# Patient Record
Sex: Male | Born: 1935 | Race: White | Hispanic: No | Marital: Married | Smoking: Former smoker
Health system: Southern US, Community
[De-identification: ages and names within clinical notes are randomized; demographics above are authoritative.]

---

## 2017-08-08 ENCOUNTER — Ambulatory Visit (INDEPENDENT_AMBULATORY_CARE_PROVIDER_SITE_OTHER): Payer: Medicare Other | Admitting: Internal Medicine

## 2017-08-08 ENCOUNTER — Ambulatory Visit (HOSPITAL_COMMUNITY)
Admission: RE | Admit: 2017-08-08 | Discharge: 2017-08-08 | Disposition: A | Discharge status: Home / Self Care | Payer: Medicare Other | Source: Ambulatory Visit | Attending: Internal Medicine | Admitting: Internal Medicine

## 2017-08-08 ENCOUNTER — Encounter: Payer: Self-pay | Admitting: Internal Medicine

## 2017-08-08 VITALS — BP 128/74 | HR 74 | Ht 68.0 in | Wt 177.0 lb

## 2017-08-08 DIAGNOSIS — J84112 Idiopathic pulmonary fibrosis: Secondary | ICD-10-CM

## 2017-08-08 DIAGNOSIS — I700 Atherosclerosis of aorta: Secondary | ICD-10-CM | POA: Diagnosis not present

## 2017-08-08 DIAGNOSIS — R590 Localized enlarged lymph nodes: Secondary | ICD-10-CM | POA: Insufficient documentation

## 2017-08-08 DIAGNOSIS — J849 Interstitial pulmonary disease, unspecified: Secondary | ICD-10-CM | POA: Diagnosis not present

## 2017-08-08 DIAGNOSIS — J9611 Chronic respiratory failure with hypoxia: Secondary | ICD-10-CM | POA: Diagnosis not present

## 2017-08-08 DIAGNOSIS — I2510 Atherosclerotic heart disease of native coronary artery without angina pectoris: Secondary | ICD-10-CM | POA: Insufficient documentation

## 2017-08-08 DIAGNOSIS — I288 Other diseases of pulmonary vessels: Secondary | ICD-10-CM | POA: Diagnosis not present

## 2017-08-08 NOTE — Progress Notes (Signed)
Subjective:    Patient ID: Aaron Chandler, male    DOB: [birth year], 84 y.o.   MRN: [id number]  PCP Aaron Covey, MD, [address], [place], [place], [address], telephone number [phone] Primary pulmonologist : Aaron Chandler, [address], [place], [place] [address], telephone number [phone] and fax number [phone]   HPI   [name] presents for new evaluation the ILD clinic at our care center network of lady with the pulmonary fibrosis foundation.  He is here for a second opinion on his pulmonary fibrosis and discussions about prognosis and treatment options.  He is accompanied by his daughter Aaron Chandler and his wife Aaron Chandler. history is provided by him and by talking to his wife and daughter and also by reviewing the outside medical records.     History from our [place]  interstitial lung disease specific questionnaire  Symptoms: He first noticed insidious onset of dyspnea after playing golf and while climbing up a hill.  This was in April while he was living in [place].  [place] is originally from [place].  He was seen by p local pulmonologist who referred him to Aaron Chandler at [facility] who gave him a diagnosis of IPF based on history, blood work and CT scan of the chest.  No biopsy was done according to the history.  He was initially only on observation therapy and basic supportive care.  In 2017 he moved to [place], [place] and started seeing Aaron Chandler where according to the notes he was initially on nintedanib and then after having intolerance to that since October 2018 has been on pirfenidone without any side effects other than mild low appetite.  He is continue to take Pirfenidone (Esbriet) at full dose currently.  He feels over time his disease has progressed.  Sometime in 2015 or 16 he started requiring oxygen.  And for the last year he has been on resting oxygen 4-6 L and with exertion  requiring 10-15 L.  He has been requiring this oxygen at least for the last 1 year.  His last CT scan was a few years ago.  Specifically he states that his pulmonary function test has been stable for the last few years but his oxygen needs have gotten worse.  In addition he tells me that his primary function test only showed moderate restriction but his oxygen needs seem out of proportion to that and is wondering why.  Of note, he also has a chronic cough since 2016 he has never had hemoptysis.  With progression cough is gotten worse but dyspnea is clearly the more important symptom.  Currently with the oxygen he has level 5 dyspnea walking on level at his own pace or walking on level with others of his age or walking up a hill or walking upstairs or making the bed or doing any shopping.  He has level 3 dyspnea for doing dishes or doing his daily toiletries.  He is completely overwhelmed by his shortness of breath.  In fact he is using a wheelchair in his apartment to get from place to place especially to the dining area  Key review of systems: He has had fatigue for the last few to several years.  He has had dysphagia for the last few to several years.  He has dry eyes for the last few to several years he has lost 15 pounds of weight in the last few to several months coinciding with his  intake of pirfenidone.  He has also has acid reflux heartburn for the last few to several months.  He does have some snoring and excessive sleepiness but no rash or ulcers  Past medical history: He has had SVT and I reviewed a cardiology note about this for the last few years.  He denies any COPD or collagen vascular disease or vasculitis or sleep apnea.  There is no diabetes or thyroid disease of stroke or seizures or hepatitis or tuberculosis or kidney disease or history of pneumonia or blood clots or pleurisy.  He did have a acute cholecystitis in 2017 and got septic from this.  They decided not operate on him because of his  pulmonary fibrosis  Family history of pulmonary disease: Denies any pulmonary fibrosis, COPD asthma sarcoid cystic fibrosis hypersensitivity pneumonitis or autoimmune disease  Exposure history: He did smoke cigars for 15 years but denies any mold exposure.  From 2 to age 66 he grew up being exposed to his parents smoking cigarettes  Occupational history: He was an Nutritional therapist and as such denies any occupation that can potentially predispose to interstitial lung disease either through organic or inorganic dust.  No ocular exposure history at home  Medication history: No evidence of taking any pulmonary toxic medications on extensive med review   [name] for review  PFT from [facility] Aaron Lindley [facility]  PFT data 10/26/15  02/09/17 07/17/17  FVC 2.18/64% 1.95/58% 2.28/68%  DLCO 8/42% 6.4/36% x   ECHO [facility] Oct 2017  - Moderate RV dilation. EF normal,.   LABS Nov 2018  - Normal LFT and hgb   Questions from him and family: There are specific questions are about prognosis, the role of anti-fibrotic therapy, measures of efficacy, possible involvement and research trials and difficulties with logistics are on high oxygen use. They seem particularly frustrated about [employer] and caps about o2 use and diffculty getting him the oxygen he needs        has no past medical history on file.   reports that he quit smoking about 20 years ago. His smoking use included cigars. He quit after 15.00 years of use. He has never used smokeless tobacco.Not on File  Immunization History  Administered Date(s) Administered  . Influenza, High Dose Seasonal PF 12/12/2016  . Pneumococcal-Unspecified 03/03/2016    No family history on file.   Current Outpatient Medications:  .  aspirin 81 MG EC tablet, Take by mouth., Disp: , Rfl:  .  bisacodyl (DULCOLAX) 5 MG EC tablet, Take 5 mg by mouth daily., Disp: , Rfl:  .  diltiazem (CARDIZEM CD) 120 MG 24 hr capsule, TK ONE C PO HS, Disp: ,  Rfl: 2 .  ESBRIET 801 MG TABS, , Disp: , Rfl: 10 .  Multiple Vitamins-Minerals (CENTRUM SILVER) CHEW, , Disp: , Rfl:  .  ondansetron (ZOFRAN-ODT) 4 MG disintegrating tablet, DISSOLVE 1-2 TABLETS UNDER TONGUE EVERY 6 HOURS AS NEEDED FOR NAUSEA, Disp: , Rfl: 2 .  OXYGEN, Inhale into the lungs., Disp: , Rfl:  .  pantoprazole (PROTONIX) 40 MG tablet, , Disp: , Rfl: 0 .  propafenone (RYTHMOL) 150 MG tablet, TAKE 1 TABLET BY MOUTH EVERY 8 HOURS, Disp: , Rfl:  .  simvastatin (ZOCOR) 40 MG tablet, Take 40 mg by mouth at bedtime., Disp: , Rfl: 1     Review of Systems  Constitutional: Positive for unexpected weight change. Negative for fever.  HENT: Positive for congestion, dental problem, postnasal drip and sneezing. Negative for  ear pain, nosebleeds, rhinorrhea, sinus pressure, sore throat and trouble swallowing.   Eyes: Negative for redness and itching.  Respiratory: Positive for cough, shortness of breath and wheezing. Negative for chest tightness.   Cardiovascular: Positive for palpitations. Negative for leg swelling.  Gastrointestinal: Positive for nausea. Negative for vomiting.  Genitourinary: Negative for dysuria.  Musculoskeletal: Negative for joint swelling.  Skin: Negative for rash.  Allergic/Immunologic: Negative.  Negative for environmental allergies, food allergies and immunocompromised state.  Neurological: Negative for headaches.  Hematological: Does not bruise/bleed easily.  Psychiatric/Behavioral: Positive for dysphoric mood. The patient is nervous/anxious.        Objective:   Physical Exam  Vitals:   08/08/17 1349  BP: 128/74  Pulse: 74  SpO2: 95%  Weight: 177 lb (80.3 kg)  Height:  (1.727 m)    Estimated body mass index is 26.91 kg/m as calculated from the following:   Height as of this encounter:  (1.727 m).   Weight as of this encounter: 177 lb (80.3 kg).    General Appearance:    Looks stable on motorized scooter  Head:    Normocephalic, without  obvious abnormality, atraumatic  Eyes:    PERRL - yes, conjunctiva/corneas - clea      Ears:    Normal external ear canals, both ears  Nose:   NG tube - no hbut has Nsasal o2 on  Throat:  ETT TUBE - no , OG tube - no  Neck:   Supple,  No enlargement/tenderness/nodules     Lungs:     Crackles 3./4 up poisteriorly and throughout anteriorly  Chest wall:    No deformity  Heart:    S1 and S2 normal, no murmur, CVP - no.  Pressors - no  Abdomen:     Soft, no masses, no organomegaly  Genitalia:    Not done  Rectal:   not done  Extremities:   Extremities- intact but has clubbing and some peripheral cyanosis     Skin:   Intact in exposed areas .     Neurologic:   Sedation - none -> RASS - na . Moves all 4s - yes. CAM-ICU - neg . Orientation - x3+          Assessment & Plan:     ICD-10-CM   1. IPF (idiopathic pulmonary fibrosis) (HCC) J84.112   2. Interstitial pulmonary disease (HCC) J84.9 CT Chest High Resolution  3. Chronic respiratory failure with hypoxia (HCC) J96.11    Diagnosis: Clinically it appears that based on a history and discussion that you indeed do have idiopathic pulmonary fibrosis.  Though I do not have his high-resolution CT scan of the chest all blood autoimmune profile to confidently call it is IPF I believe he has IPF based on the fact he has had one University physician and another community pulmonologist given the same diagnosis plus his history is pretty nonspecific for any exposures or specific ILD etiologies and he is a male over 50 and he has clubbing.   Severity: Based on the oxygen need it appears that he has very severe advanced IPF.  However it was striking that his forced vital capacity has remained stable in the last 2 years but his oxygen needs have gotten worse.  This appears to correlate with worsening DLCO.  Therefore I wonder if he has coexistent emphysema or worsening cor pulmonale.  Based on the 2017 October echo I suspect that he has significant  pulmonary hypertension/cor pulmonale contributing to his  worsening oxygen needs and chronic hypoxemic respiratory failure.  This would be a very difficult condition to treat particularly in the setting of IPF which would make it group 3 PAH.  I have recommended an echo to start with.  He wants to do it locally and send me the report which I have agreed to.  However he will do a high-resolution CT scan of the chest here locally so he goes back to the [place] area today.   Treatment: Oxygen- most of the frustrations appear to be around his oxygen needs.  The family is extremely frustrated with [employer] the current oxygen supplier.  They have talked to [employer] another oxygen company.  They are frustrated with the poor customer service and the cap on the flow being made by the oxygen company.  The increased oxygen needs is restricting his mobility.  They are also worried about payment denials.  They are also frustrated by the lack of innovative solutions by the oxygen company to give him more oxygen.  I have given him a link for a survey to the pulmonary fibrosis foundation to help address this issue that many IPF patients face.  This will not solve his problem per se.  I have connected him with Mr. Army Chaco of patient support group chair in [facility] to see if any patient related solutions could be ironed out for Mr. Reddy  Treatment Pirfenidone (Esbriet): We discussed Pirfenidone (Esbriet).  We recognize the inherent limitations in assessing whether this drug is being efficacious for him.  Given the minimal amount of side effects that he is having and the advanced disease and some of the emerging data about benefit even in the face of progression I think it is best that he continues the drug.   Research trials: He and his daughter are very interested in research trials.  Through our research partner [employer], [facility] the office several research trials for IPF.  However the general criteria appears  to be that for all face to in phase 3 trials the diagnosis is to be within 2 or 3 years.  He falls outside his window given the fact his diagnosis was in April 2014.  Moreover his extreme low DLCO would be a disqualification.  We might have a phase 1 trial but he is not appropriately interested in this because of the first time it would go from animal to human.  General supportive measures: When he comes back at his next visit I could try to introduce him inspiratory muscle load training.  There is no clear-cut benefit about this but of had 1 or 2 patients tell me that this is helped them subjectively  Other  pillars of ILD /IPF / Pulmonary Fibrosis Managment to be discussed over time  - nutrition: not discussed  - support groups: not fully discussed but have connected him to our local support group chair to help him with o2 issue  - transplant: not a candidate du to age  - goals and palliation: to be discussed     > 50% of this > 40 min visit spent in face to face counseling or/and coordination of care    Aaron. Kalman Shan, M.D., [facility].C.P Pulmonary and Critical Care Medicine Staff Physician, [facility] Center Director - Interstitial Lung Disease  Program  Pulmonary Fibrosis [facility] at [facility], [place], [address]  Pager: [phone], If no answer or between  15:00h - 7:00h: call [phone] Telephone: [phone]

## 2017-08-08 NOTE — Patient Instructions (Signed)
ICD-10-CM   1. IPF (idiopathic pulmonary fibrosis) (HCC) J84.112   2. Interstitial pulmonary disease (HCC) J84.9 CT Chest High Resolution  3. Chronic respiratory failure with hypoxia (HCC) J96.11    Clinically it appears that based on a history and discussion that you indeed do have idiopathic pulmonary fibrosis  Based on oxygen need it appears that you have advanced disease.   However it is unclear to me why you are forced vital capacity on the pulmonary function test has not changed in 2 years although your oxygen need has gotten worse during this time  Therefore we need to rule out some other pulmonary related conditions that might or might not exist  Also given the greater than 5 years since diagnosis and advance oxygen need most of the research trials are not under consideration at this point in time  Currently it appears that you are mostly limited because of your profound oxygen needs with exertion and you are having a frustrating time getting adequate oxygen supply  It is impossible to know if the Pirfenidone [employer]) is working in your particular situation or not but given the fact that you are tolerating it well and there is potential for benefit you should continue that   Plan I believe that you should continue Pirfenidone (Esbriet) and Protonix as recommended by your local pulmonary physician but please ensure that you have your liver function tests checked every 3 months and he apply sunscreen  Do echocardiogram locally particularly looking for pulmonary hypertension  Do high-resolution CT chest today with [place] (supine image only]  Continue oxygen as before  I have connected you with Mr. Army Chaco via email to address some of the oxygen questions and for you to fill out the oxygen survey

## 2017-08-09 ENCOUNTER — Other Ambulatory Visit: Payer: Self-pay | Admitting: *Deleted

## 2017-08-09 DIAGNOSIS — J84112 Idiopathic pulmonary fibrosis: Secondary | ICD-10-CM

## 2017-08-09 DIAGNOSIS — J849 Interstitial pulmonary disease, unspecified: Secondary | ICD-10-CM

## 2017-08-10 ENCOUNTER — Telehealth: Payer: Self-pay | Admitting: Internal Medicine

## 2017-08-10 DIAGNOSIS — J849 Interstitial pulmonary disease, unspecified: Secondary | ICD-10-CM

## 2017-08-10 NOTE — Telephone Encounter (Signed)
  Gave CT repoirts to wife - c/with UIP seen in IPF Hypoxemia lkely du eto pulm htn and ILD.  Wife does not remember patient serologies were done in [place]. So, likely done years ago at [facility]. Recommended we do those through PCP Carley Hammed, MD office and iif positive and echo abnormal for elevated PASP might be worth pursuing pulm htn workup  So Raquel Sarna please call PCP office and have them do for [place]   - Serum: ESR, ACE, ANA, DS-DNA, RF, anti-CCP, ssA, ssB, scl-70, ANCA screen, MPO, PR-3, Total CK,  RNP, Aldolase,  Hypersensitivity Pneumonitis Panel  - also patient echo 08/22/17 - will need those results sent to [place]  - further followwup based on above  -    Ct Chest High Resolution  Result Date: 08/08/2017 CLINICAL DATA:  Interstitial lung disease. Chronic hypoxia. Weight loss of 20 pounds over the prior 3 months. Cough. Former cigar smoker. EXAM: CT CHEST WITHOUT CONTRAST TECHNIQUE: Multidetector CT imaging of the chest was performed following the standard protocol without intravenous contrast. High resolution imaging of the lungs, as well as inspiratory and expiratory imaging, was performed. COMPARISON:  None. FINDINGS: Cardiovascular: Top-normal heart size. No significant pericardial effusion/thickening. Three-vessel coronary atherosclerosis. Atherosclerotic nonaneurysmal thoracic aorta. Dilated main pulmonary artery (3.7 cm diameter). Mediastinum/Nodes: No discrete thyroid nodules. Unremarkable esophagus. No axillary adenopathy. Mildly enlarged 1.2 cm right paratracheal node (series 2/image 45). Mildly enlarged 1.2 cm subcarinal node (series 2/image 66). Mildly enlarged 1.0 cm AP window node (series 2/image 54). No discrete hilar adenopathy on these noncontrast images. Lungs/Pleura: No pneumothorax. No pleural effusion. No acute consolidative airspace disease, lung masses or significant pulmonary nodules. No significant air trapping on the expiration sequence. There is extensive patchy  confluent subpleural reticulation and ground-glass attenuation throughout both lungs with associated mild-to-moderate traction bronchiectasis, architectural distortion and volume loss. There is a dependent basilar gradient to these findings. There is scattered mild honeycombing at both lung bases, most prominent at the right lung base (series 4/image 21). Upper abdomen: Scattered pneumobilia in the visualized liver without intrahepatic biliary ductal dilatation. Musculoskeletal: No aggressive appearing focal osseous lesions. Marked thoracic spondylosis. IMPRESSION: 1. Spectrum of findings compatible with dependent basilar predominant fibrotic interstitial lung disease with mild honeycombing. 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 2. Nonspecific mild mediastinal lymphadenopathy, probably reactive. 3. Dilated main pulmonary artery, suggesting pulmonary arterial hypertension. 4. Three-vessel coronary atherosclerosis. 5. Pneumobilia in the visualized liver. Presumably this is due to a history of sphincterotomy or biliary enteric anastomosis. Recommend correlation with procedural history. If there is no procedural history to explain this finding, recommend dedicated CT abdomen/pelvis with oral and IV contrast for further evaluation. Aortic Atherosclerosis (ICD10-I70.0). Electronically Signed   By: Ilona Sorrel M.D.   On: 08/08/2017 17:53

## 2017-08-11 NOTE — Telephone Encounter (Signed)
Called [facility] where pt's pcp Dr. Ali Lowe is located at and spoke with Darlene.  I stated to her we were wanting pt to have some labwork done at that office due to pt being closer to pcp office than our office of [facility].  I stated to Darlene that I would order the labwork and then fax it to their office.  The fax number is [phone] which is the fax number for the corporate office. She stated to me to put in attention to Dr. Maisie Fus so that way he would get the lab orders.  I am also going to fax pt's most recent HRCT scan and OV from our office to them as well so that way they have those records.

## 2017-08-17 ENCOUNTER — Telehealth: Payer: Self-pay | Admitting: Internal Medicine

## 2017-08-17 NOTE — Telephone Encounter (Signed)
Attempted to contact Victorino Dike I did not receive an answer. There was no option for me to leave a message. Will try back.

## 2017-08-18 NOTE — Telephone Encounter (Signed)
lmtcb for Jennifer.  °

## 2017-08-22 ENCOUNTER — Encounter: Payer: Self-pay | Admitting: Internal Medicine

## 2017-08-22 NOTE — Telephone Encounter (Signed)
[facility] needs the orders for labs faxed over Fax#[phone]  Attention Leisa Lenz  [phone]

## 2017-08-22 NOTE — Telephone Encounter (Signed)
Spoke with [facility] and they needed the labs and echo order faxed to their office. The patient has an appt at their office at 1:45 and needs the order as soon as possible. Orders sent to [phone]. The operator stated she received them and nothing further is needed.

## 2017-08-22 NOTE — Telephone Encounter (Signed)
Called Victorino Dike back and she was unavailable at the time but I left message for her to call back to discuss. Will await return call.

## 2017-08-22 NOTE — Telephone Encounter (Signed)
lmtcb for [employer]. Need to know what labs are being requested.

## 2017-08-29 ENCOUNTER — Telehealth: Payer: Self-pay | Admitting: Internal Medicine

## 2017-08-29 NOTE — Telephone Encounter (Signed)
Returned Patient call. Spoke with [name].  Echo was done 08/24/17 per Patient.  Explained echo results were not available at this time, but someone will contact her as soon as they are.  Nothing further needed at this time.

## 2017-08-30 ENCOUNTER — Telehealth: Payer: Self-pay | Admitting: Internal Medicine

## 2017-08-30 NOTE — Telephone Encounter (Signed)
Attempted to call pt's wife. I did not receive an answer. I have left a message for pt's wife to return our call.

## 2017-08-31 NOTE — Telephone Encounter (Signed)
Lab orders have been faxed. Pt's wife is aware. Nothing further was needed.

## 2017-09-07 ENCOUNTER — Other Ambulatory Visit: Payer: Self-pay | Admitting: Internal Medicine

## 2017-09-07 ENCOUNTER — Telehealth: Payer: Self-pay | Admitting: Internal Medicine

## 2017-09-07 NOTE — Telephone Encounter (Signed)
Called and spoke with patients wife, advised her that we have sent the fax to [employer] [employer] again. Advised her to give us a call if anything else is needed.

## 2017-09-07 NOTE — Telephone Encounter (Signed)
His appt is at 11:00 am this morning and need sent ASAP.

## 2017-09-07 NOTE — Telephone Encounter (Signed)
Let Aaron RabonDonald Chandler know tht I reviewed echo 08/22/17 and this shows right heart failure - this is because of the IPF and accuotns for more o2 need. I did d/w a IPF specialist in [place] - and at this point for Aaron Rabononald [place] - o2 and esbriet best possible. REcommedn home palliation  Happy to see him again if he wants to make the trip  Please send my note to his pulm - only if he gives green signal on that  Dr. Kalman ShanMurali Dalene Robards, M.D., [facility].C.P Pulmonary and Critical Care Medicine Staff Physician, [facility] Center Director - Interstitial Lung Disease  Program  [facility]ulmonary Fibrosis [facility] [place], [place], [id number]  Pager: [phone], If no answer or between  15:00h - 7:00h: call [phone] Telephone: [phone]

## 2017-09-08 NOTE — Telephone Encounter (Signed)
Spoke with pt. He is aware of his Echo results. Pt is requesting to have his lab results from 09/07/17.  Dr. Conni Elliotamawamy - please advise on results. Thanks.

## 2017-09-08 NOTE — Telephone Encounter (Signed)
Pt is returning call about results. Cb is [phone]

## 2017-09-08 NOTE — Telephone Encounter (Signed)
Attempted to call pt but unable to reach him.  Left message for pt to return call x1 

## 2017-09-11 ENCOUNTER — Telehealth: Payer: Self-pay | Admitting: Internal Medicine

## 2017-09-11 LAB — PAN-ANCA
ANCA Proteinase 3: <3.5 U/mL (ref 0.0–3.5)
Atypical pANCA: <1:20 {titer}
Myeloperoxidase Ab: <9 U/mL (ref 0.0–9.0)
P-ANCA: <1:20 {titer}

## 2017-09-11 LAB — SJOGREN'S SYNDROME ANTIBODS(SSA + SSB)
ENA SSA (RO) Ab: <0.2 AI (ref 0.0–0.9)
ENA SSB (LA) Ab: <0.2 AI (ref 0.0–0.9)

## 2017-09-11 LAB — SEDIMENTATION RATE: Sed Rate: 60 mm/hr — ABNORMAL HIGH (ref 0–30)

## 2017-09-11 LAB — ANTI-DNA ANTIBODY, DOUBLE-STRANDED

## 2017-09-11 LAB — HYPERSENSITIVITY PNUEMONITIS PROFILE
A. PULLULANS ABS: NEGATIVE
ASPERGILLUS FLAVUS ANTIBODIES: NEGATIVE
ASPERGILLUS FUMIGATUS 2: NEGATIVE
Aspergillus Niger Antibodies: NEGATIVE
Micropolyspora faeni, IgG: NEGATIVE
Pigeon Serum Abs: NEGATIVE
THERMOACTINOMYCES VULGARIS IGG: NEGATIVE

## 2017-09-11 LAB — CK TOTAL AND CKMB (NOT AT ARMC)
CK-MB Index: 3.3 ng/mL (ref 0.0–10.4)
Total CK: 56 U/L (ref 24–204)

## 2017-09-11 LAB — ANA: Anti Nuclear Antibody(ANA): NEGATIVE

## 2017-09-11 LAB — CYCLIC CITRUL PEPTIDE ANTIBODY, IGG/IGA: CYCLIC CITRULLIN PEPTIDE AB: 4 U (ref 0–19)

## 2017-09-11 LAB — RHEUMATOID FACTOR

## 2017-09-11 LAB — ANTI-SCLERODERMA ANTIBODY: Scleroderma SCL-70: <0.2 AI (ref 0.0–0.9)

## 2017-09-11 LAB — RNP ANTIBODIES: ENA RNP AB: 0.2 AI (ref 0.0–0.9)

## 2017-09-11 LAB — ANGIOTENSIN CONVERTING ENZYME: ANGIO CONVERT ENZYME: 40 U/L (ref 14–82)

## 2017-09-11 LAB — ALDOLASE: Aldolase: 3.6 U/L (ref 3.3–10.3)

## 2017-09-11 NOTE — Telephone Encounter (Signed)
Spoke with the pt's spouse  She is requesting lab results and also wants further information on his heart failure found on ECHO  Please advise, thanks

## 2017-09-11 NOTE — Telephone Encounter (Signed)
Pt was called today, 6/17 and made aware of lab results. Nothing further needed.

## 2017-09-11 NOTE — Telephone Encounter (Signed)
Called and spoke with pt letting him know the results of his labwork and also his confirming dx of IPF and based on the dx that his Esbriet and O2 are what is the best option even with pulm htn seen on echo.  Pt expressed understanding with the above stated but wanted to know if there was anything pt needed to worry about with what was seen on the scan (if pt needed to worry about heart attacks or anything more serious and also the severity of the IPF).  MR, pt would like for you to call him at your convenience to discuss things with him.  Pt can be reached at [phone]. Thanks!

## 2017-09-11 NOTE — Telephone Encounter (Signed)
Blood lab 09/07/17 - no evidence of autoimmune or vasculitis. THereby, confirming diagnosis of IPF  REgarding pulmonary hypertension on echo - in his particular sitution iwht IPF  - pulm htn meds do not work. O2/esbriet best option  If they still need to d/w me let me know    Results for Lorina RabonSTEWART, Ulisses (MRN [id number]) as of 09/11/2017 15:39  Ref. Range 09/07/2017 10:47  CK Total Latest Ref Range: 24 - 204 U/L 56  CK-MB Index Latest Ref Range: 0.0 - 10.4 ng/mL 3.3  Aldolase Latest Ref Range: 3.3 - 10.3 U/L 3.6  Sed Rate Latest Ref Range: 0 - 30 mm/hr 60 (H)  A fumigatus #2 Latest Ref Range: NEGATIVE  Negative  Anti Nuclear Antibody(ANA) Latest Ref Range: Negative  Negative  ANCA Proteinase 3 Latest Ref Range: 0.0 - 3.5 U/mL <3.5  Atypical pANCA Latest Ref Range: Neg:<1:20 titer <1:20  Cyclic Citrullin Peptide Ab Latest Ref Range: 0 - 19 units 4  dsDNA Ab Latest Ref Range: 0 - 9 IU/mL <1  ENA RNP Ab Latest Ref Range: 0.0 - 0.9 AI 0.2  ENA SSA (RO) Ab Latest Ref Range: 0.0 - 0.9 AI <0.2  ENA SSB (LA) Ab Latest Ref Range: 0.0 - 0.9 AI <0.2  Myeloperoxidase Ab Latest Ref Range: 0.0 - 9.0 U/mL <9.0  RA Latex Turbid. Latest Ref Range: 0.0 - 13.9 IU/mL <10.0  Scleroderma SCL-70 Latest Ref Range: 0.0 - 0.9 AI <0.2  Cytoplasmic (C-ANCA) Latest Ref Range: Neg:<1:20 titer <1:20  P-ANCA Latest Ref Range: Neg:<1:20 titer <1:20  Micropolyspora faeni, IgG Latest Ref Range: Negative  Negative  Thermoactinomyces vulgaris, IgG Latest Ref Range: Negative  Negative  A. Pullulans Abs Latest Ref Range: Negative  Negative  Pigeon Serum Abs Latest Ref Range: Negative  Negative  Aspergillus Luxembourgiger Antibodies Latest Ref Range: Negative  Negative  Aspergillus Flavus Antibodies Latest Ref Range: Negative  Negative

## 2017-09-18 NOTE — Telephone Encounter (Signed)
MR, please advise if anything else is needed. Thanks!  

## 2017-09-21 NOTE — Telephone Encounter (Addendum)
D.w wife - on 10 + 10 [place] o2 at night. And 6-8:L [place] at day time. Advised cor pulmonale is refractory. Nose is running. D/w wife about palliative care v hospice. Recommmend home palliative care. ANd to talk to pcp or local pulmonary doc. And advised to take esbriet as long as possible. No further followup. Given life expectancy of several months to under a year.. Or rarely very rarely few years

## 2017-09-22 ENCOUNTER — Telehealth: Payer: Self-pay | Admitting: Internal Medicine

## 2017-09-22 NOTE — Telephone Encounter (Signed)
Patient daughter, Geoffery SpruceLeann called.  She is questioning medication being giving to treat pulmonary hypertension.  She said that they were told that Patient had pulmonary hypertension. She stated that MR called 09/21/17, about possible palliative care.  She is wanting to know if there is anything left that can be done.  She is requesting to speak to MR if possible. Nothing in chart about phone call 09/21/17.  Daughter Geoffery [phone]  Will route message to MR

## 2017-09-23 NOTE — Telephone Encounter (Signed)
Called daughter Aaron Chandler - >  1. Pulm HTN -> advised no medicine for this in setting of IPF. rx is supportive 2. Explained most likely mechanism of death - is progressive resp failure 3. Daughter says he is significantly worse 4. Recommend palliative care/hospice -> hospice over pall care recommended   10 min call   Dr. [name], M.D., [facility].P Pulmonary and Critical Care Medicine Staff Physician, [facility] Director - Interstitial Lung Disease  Program  Pulmonary Fibrosis [facility] [place], [place], [id number]  Pager: [phone], If no answer or between  15:00h - 7:00h: call [phone] Telephone: [phone]

## 2017-11-26 DEATH — deceased

## 2019-09-10 IMAGING — CT CT CHEST HIGH RESOLUTION W/O CM
2 of 7 series · 14 of 36 positions shown, 17 images · non-contrast
Comparison: None.

CLINICAL DATA: Interstitial lung disease. Chronic hypoxia. Weight
loss of 20 pounds over the prior 3 months. Cough. Former cigar
smoker.

EXAM:
CT CHEST WITHOUT CONTRAST
TECHNIQUE: Multidetector CT imaging of the chest was performed following the
standard protocol without intravenous contrast. High resolution
imaging of the lungs, as well as inspiratory and expiratory imaging,
was performed.

[Series 6: coronal · coronal · 0.55mm/px · 3 of 89 slices shown]
[im 18/89  lung]
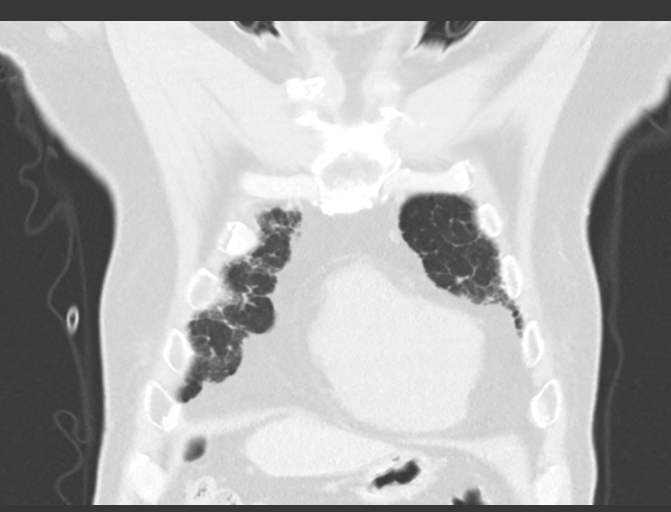
[im 36/89  lung]
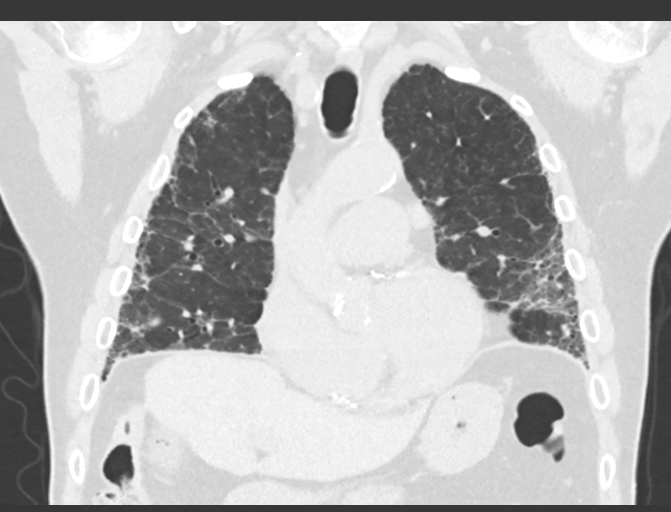
[im 53/89  lung]
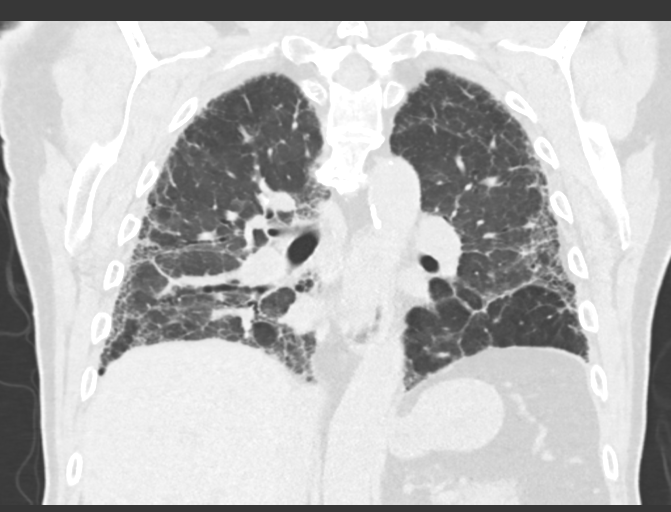

[Series 8: super d · axial · 0.71mm/px · z∈[-329,-97]mm · 11 of 268 slices shown, 14 images]
[im 18/268  mediastinal]
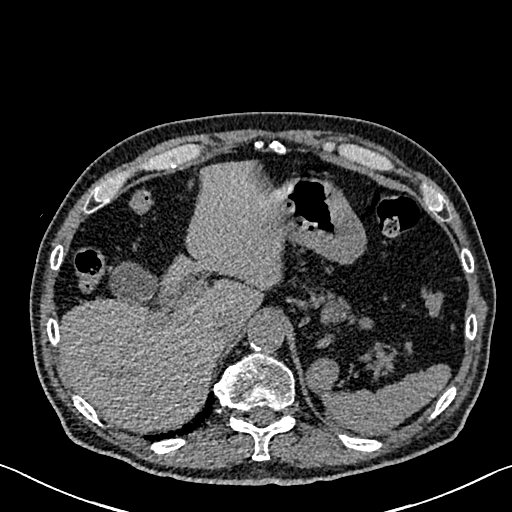
[im 18/268  lung]
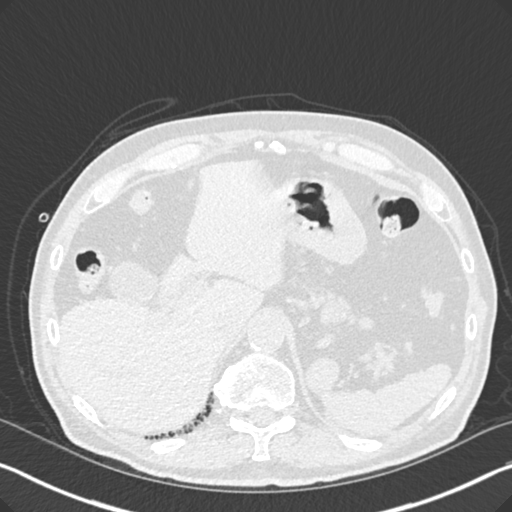
[im 36/268  lung]
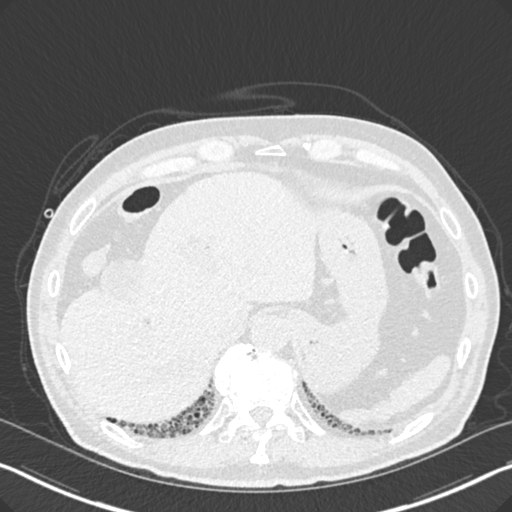
[im 72/268  lung]
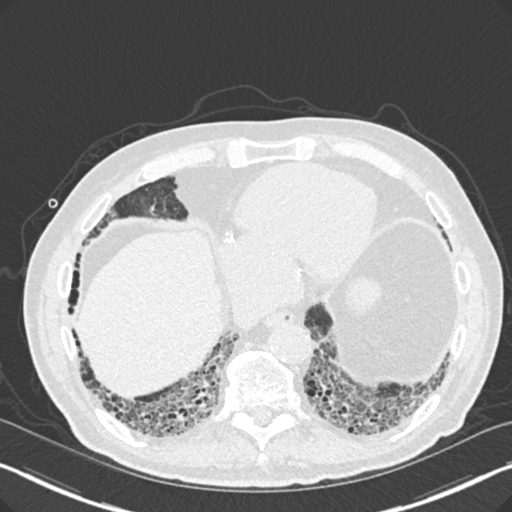
[im 90/268  lung]
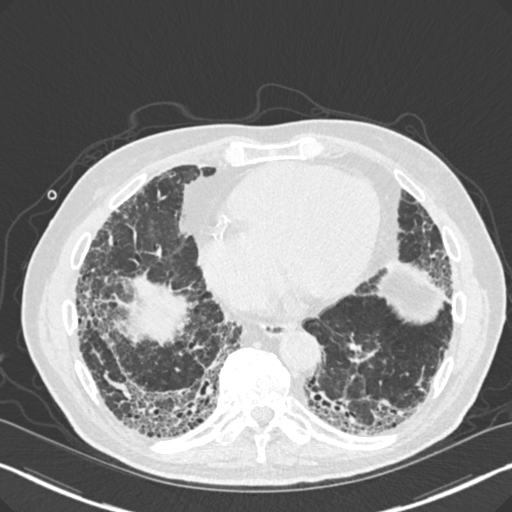
[im 107/268  mediastinal]
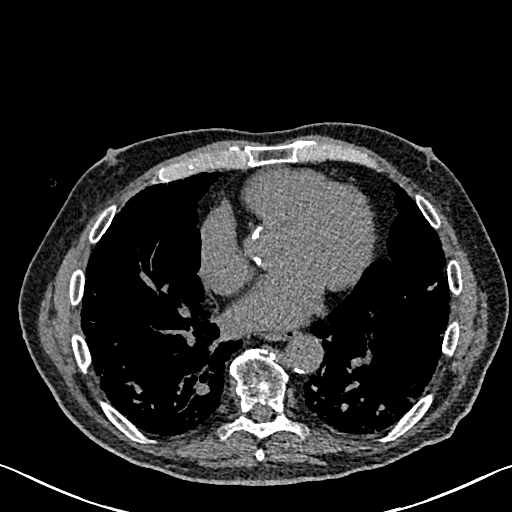
[im 107/268  lung]
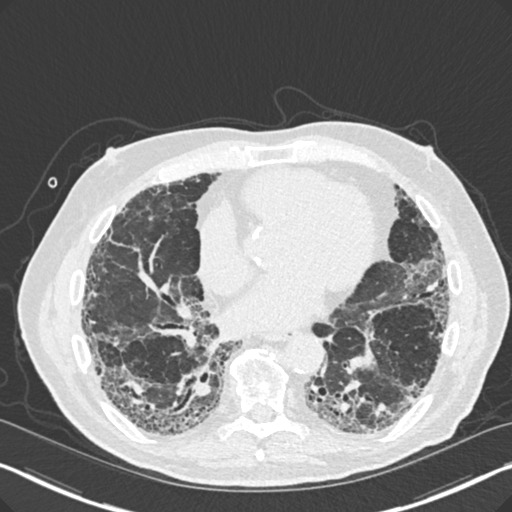
[im 143/268  lung]
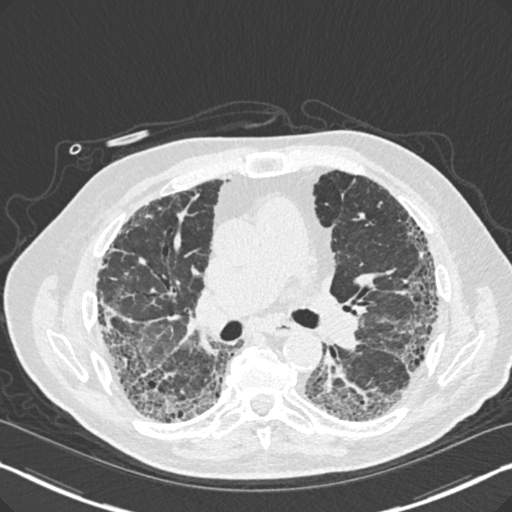
[im 161/268  lung]
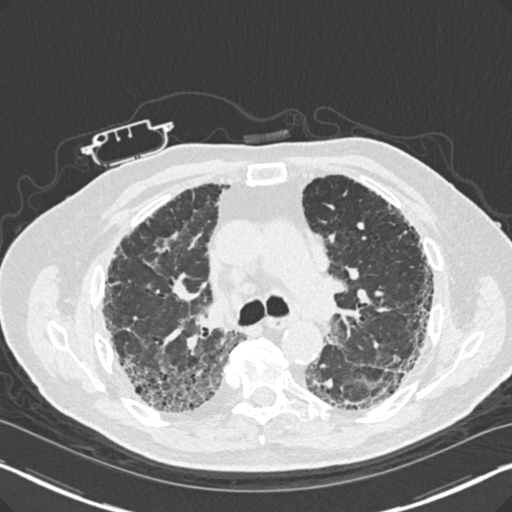
[im 179/268  lung]
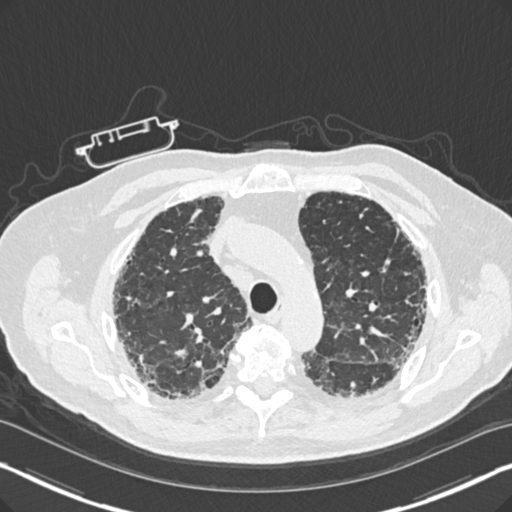
[im 196/268  mediastinal]
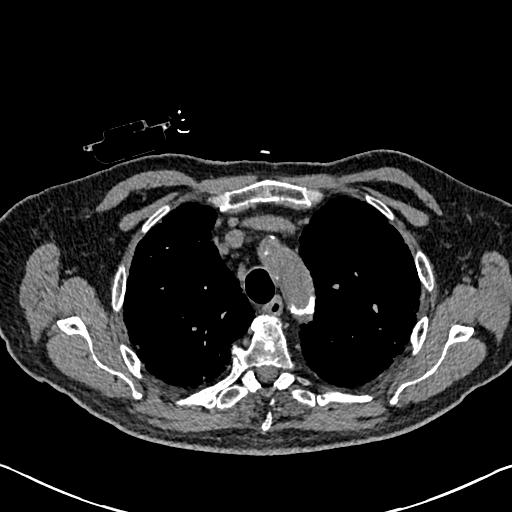
[im 196/268  lung]
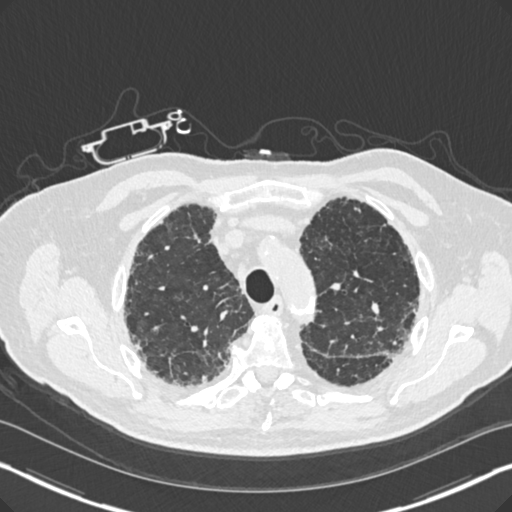
[im 232/268  lung]
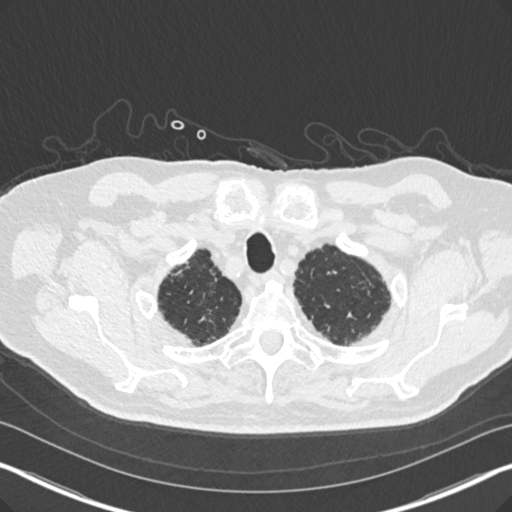
[im 250/268  lung]
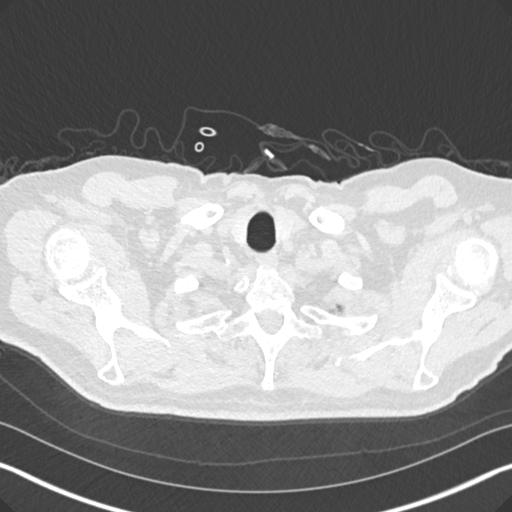

[14 of 36 positions shown; findings below may reference images not displayed]

FINDINGS: Cardiovascular: Top-normal heart size. No significant pericardial
effusion/thickening. Three-vessel coronary atherosclerosis.
Atherosclerotic nonaneurysmal thoracic aorta. Dilated main pulmonary
artery (3.7 cm diameter).

Mediastinum/Nodes: No discrete thyroid nodules. Unremarkable
esophagus. No axillary adenopathy. Mildly enlarged 1.2 cm right
paratracheal node (series 2/image 45). Mildly enlarged 1.2 cm
subcarinal node (series 2/image 66). Mildly enlarged 1.0 cm AP
window node (series 2/image 54). No discrete hilar adenopathy on
these noncontrast images.

Lungs/Pleura: No pneumothorax. No pleural effusion. No acute
consolidative airspace disease, lung masses or significant pulmonary
nodules. No significant air trapping on the expiration sequence.
There is extensive patchy confluent subpleural reticulation and
ground-glass attenuation throughout both lungs with associated
mild-to-moderate traction bronchiectasis, architectural distortion
and volume loss. There is a dependent basilar gradient to these
findings. There is scattered mild honeycombing at both lung bases,
most prominent at the right lung base (series 4/image 21).

Upper abdomen: Scattered pneumobilia in the visualized liver without
intrahepatic biliary ductal dilatation.

Musculoskeletal: No aggressive appearing focal osseous lesions.
Marked thoracic spondylosis.
IMPRESSION: 1. Spectrum of findings compatible with dependent basilar
predominant fibrotic interstitial lung disease with mild
honeycombing. Findings are considered diagnostic of usual
interstitial pneumonia (UIP).
2. Nonspecific mild mediastinal lymphadenopathy, probably reactive.
3. Dilated main pulmonary artery, suggesting pulmonary arterial
hypertension.
4. Three-vessel coronary atherosclerosis.
5. Pneumobilia in the visualized liver. Presumably this is due to a
history of sphincterotomy or biliary enteric anastomosis. Recommend
correlation with procedural history. If there is no procedural
history to explain this finding, recommend dedicated CT
abdomen/pelvis with oral and IV contrast for further evaluation.

Aortic Atherosclerosis ([id number]).
